# Patient Record
Sex: Male | Born: 1970 | Race: White | Hispanic: No | Marital: Married | Smoking: Never smoker
Health system: Southern US, Community
[De-identification: ages and names within clinical notes are randomized; demographics above are authoritative.]

## PROBLEM LIST (undated history)

## (undated) DIAGNOSIS — Z789 Other specified health status: Secondary | ICD-10-CM

## (undated) HISTORY — PX: NO PAST SURGERIES: SHX2092

---

## 2011-01-06 ENCOUNTER — Ambulatory Visit: Payer: Self-pay | Admitting: Internal Medicine

## 2014-02-27 ENCOUNTER — Ambulatory Visit: Payer: Self-pay | Admitting: Physician Assistant

## 2014-03-01 ENCOUNTER — Encounter (HOSPITAL_COMMUNITY): Payer: Self-pay | Admitting: Pharmacy Technician

## 2014-03-02 ENCOUNTER — Encounter (HOSPITAL_COMMUNITY): Payer: Self-pay

## 2014-03-02 ENCOUNTER — Other Ambulatory Visit (HOSPITAL_COMMUNITY): Payer: BC Managed Care – PPO

## 2014-03-02 ENCOUNTER — Encounter (HOSPITAL_COMMUNITY)
Admission: RE | Admit: 2014-03-02 | Discharge: 2014-03-02 | Disposition: A | Discharge status: Home / Self Care | Payer: BC Managed Care – PPO | Source: Ambulatory Visit | Attending: Orthopedic Surgery | Admitting: Orthopedic Surgery

## 2014-03-02 HISTORY — DX: Other specified health status: Z78.9

## 2014-03-02 LAB — CBC
HCT: 43.8 % (ref 39.0–52.0)
Hemoglobin: 14.9 g/dL (ref 13.0–17.0)
MCH: 30 pg (ref 26.0–34.0)
MCHC: 34 g/dL (ref 30.0–36.0)
MCV: 88.3 fL (ref 78.0–100.0)
PLATELETS: 181 10*3/uL (ref 150–400)
RBC: 4.96 MIL/uL (ref 4.22–5.81)
RDW: 12.8 % (ref 11.5–15.5)
WBC: 5.2 10*3/uL (ref 4.0–10.5)

## 2014-03-02 NOTE — Progress Notes (Signed)
Message left with scheduler ar Dr. Carlos LeveringGramig's office that orders are needed..Marland Kitchen

## 2014-03-02 NOTE — Pre-Procedure Instructions (Signed)
Billy Ramos  03/02/2014   Your procedure is scheduled on:  03-03-2014   Friday   [facility] at 5:30 AM.   Call this number if you have problems the morning of surgery: [phone]   Remember:   Do not eat food or drink liquids after midnight.    Take these medicines the morning of surgery with A SIP OF WATER: None   Do not wear jewelry.  Do not wear lotions, powders, or perfumes.    Do not shave 48 hours prior to surgery. Men may shave face and neck.   Do not bring valuables to the hospital.  [facility] is not responsible for any belongings or valuables.               Contacts, dentures or bridgework may not be worn into surgery.   Leave suitcase in the car. After surgery it may be brought to your room.   For patients admitted to the hospital, discharge time is determined by your  treatment team.               Patients discharged the day of surgery will not be allowed to drive home.   Name and phone number of your driver:    Special Instructions: See attached sheet for instructions on CHG shower/bath   Please read over the following fact sheets that you were given: Pain Booklet and Surgical Site Infection Prevention

## 2014-03-03 ENCOUNTER — Encounter (HOSPITAL_COMMUNITY): Payer: Self-pay | Admitting: *Deleted

## 2014-03-03 ENCOUNTER — Encounter (HOSPITAL_COMMUNITY)
Admission: RE | Disposition: A | Discharge status: Home / Self Care | Payer: Self-pay | Source: Ambulatory Visit | Attending: Orthopedic Surgery

## 2014-03-03 ENCOUNTER — Ambulatory Visit (HOSPITAL_COMMUNITY): Payer: BC Managed Care – PPO | Admitting: Anesthesiology

## 2014-03-03 ENCOUNTER — Ambulatory Visit (HOSPITAL_COMMUNITY)
Admission: RE | Admit: 2014-03-03 | Discharge: 2014-03-03 | Disposition: A | Discharge status: Home / Self Care | Payer: BC Managed Care – PPO | Source: Ambulatory Visit | Attending: Orthopedic Surgery | Admitting: Orthopedic Surgery

## 2014-03-03 ENCOUNTER — Encounter (HOSPITAL_COMMUNITY): Payer: BC Managed Care – PPO | Admitting: Anesthesiology

## 2014-03-03 DIAGNOSIS — Z01812 Encounter for preprocedural laboratory examination: Secondary | ICD-10-CM | POA: Insufficient documentation

## 2014-03-03 DIAGNOSIS — IMO0002 Reserved for concepts with insufficient information to code with codable children: Secondary | ICD-10-CM | POA: Insufficient documentation

## 2014-03-03 HISTORY — PX: OPEN REDUCTION INTERNAL FIXATION (ORIF) DISTAL PHALANX: SHX6236

## 2014-03-03 SURGERY — OPEN REDUCTION INTERNAL FIXATION (ORIF) DISTAL PHALANX
Anesthesia: General | Site: Finger | Laterality: Left

## 2014-03-03 MED ORDER — PROPOFOL 10 MG/ML IV BOLUS
INTRAVENOUS | Status: AC
  Filled 2014-03-03: qty 20

## 2014-03-03 MED ORDER — ONDANSETRON HCL 4 MG/2ML IJ SOLN
INTRAMUSCULAR | Status: AC
  Filled 2014-03-03: qty 2

## 2014-03-03 MED ORDER — FENTANYL CITRATE 0.05 MG/ML IJ SOLN
INTRAMUSCULAR | Status: AC
  Filled 2014-03-03: qty 5

## 2014-03-03 MED ORDER — FENTANYL CITRATE 0.05 MG/ML IJ SOLN
INTRAMUSCULAR | Status: DC | PRN
  Administered 2014-03-03: 25 ug via INTRAVENOUS
  Administered 2014-03-03: 50 ug via INTRAVENOUS

## 2014-03-03 MED ORDER — LIDOCAINE HCL (CARDIAC) 20 MG/ML IV SOLN
INTRAVENOUS | Status: DC | PRN
  Administered 2014-03-03: 80 mg via INTRAVENOUS

## 2014-03-03 MED ORDER — CEFAZOLIN SODIUM-DEXTROSE 2-3 GM-% IV SOLR
INTRAVENOUS | Status: AC
  Filled 2014-03-03: qty 50

## 2014-03-03 MED ORDER — CEPHALEXIN 500 MG PO CAPS: 500.0000 mg | ORAL_CAPSULE | Freq: Four times a day (QID) | ORAL | Status: AC

## 2014-03-03 MED ORDER — BUPIVACAINE HCL (PF) 0.25 % IJ SOLN
INTRAMUSCULAR | Status: DC | PRN
  Administered 2014-03-03: 7 mL

## 2014-03-03 MED ORDER — CEFAZOLIN SODIUM-DEXTROSE 2-3 GM-% IV SOLR
2.0000 g | Freq: Once | INTRAVENOUS | Status: AC
  Administered 2014-03-03: 2 g via INTRAVENOUS
  Filled 2014-03-03: qty 50

## 2014-03-03 MED ORDER — ONDANSETRON HCL 4 MG/2ML IJ SOLN
INTRAMUSCULAR | Status: DC | PRN
  Administered 2014-03-03: 4 mg via INTRAVENOUS

## 2014-03-03 MED ORDER — FENTANYL CITRATE 0.05 MG/ML IJ SOLN: 25.0000 ug | INTRAMUSCULAR | Status: DC | PRN

## 2014-03-03 MED ORDER — ROCURONIUM BROMIDE 50 MG/5ML IV SOLN
INTRAVENOUS | Status: AC
  Filled 2014-03-03: qty 1

## 2014-03-03 MED ORDER — PROPOFOL 10 MG/ML IV BOLUS
INTRAVENOUS | Status: DC | PRN
  Administered 2014-03-03: 200 mg via INTRAVENOUS

## 2014-03-03 MED ORDER — MIDAZOLAM HCL 2 MG/2ML IJ SOLN
INTRAMUSCULAR | Status: AC
  Filled 2014-03-03: qty 2

## 2014-03-03 MED ORDER — LIDOCAINE HCL (CARDIAC) 20 MG/ML IV SOLN
INTRAVENOUS | Status: AC
  Filled 2014-03-03: qty 5

## 2014-03-03 MED ORDER — MIDAZOLAM HCL 5 MG/5ML IJ SOLN
INTRAMUSCULAR | Status: DC | PRN
  Administered 2014-03-03 (×2): 1 mg via INTRAVENOUS

## 2014-03-03 MED ORDER — OXYCODONE HCL 5 MG PO TABS: 5.0000 mg | ORAL_TABLET | ORAL | Status: AC | PRN

## 2014-03-03 MED ORDER — BUPIVACAINE HCL (PF) 0.25 % IJ SOLN
INTRAMUSCULAR | Status: AC
  Filled 2014-03-03: qty 30

## 2014-03-03 MED ORDER — LACTATED RINGERS IV SOLN
INTRAVENOUS | Status: DC | PRN
Start: 2014-03-03 — End: 2014-03-03
  Administered 2014-03-03: 07:00:00 via INTRAVENOUS

## 2014-03-03 MED ORDER — ONDANSETRON HCL 4 MG/2ML IJ SOLN
4.0000 mg | Freq: Four times a day (QID) | INTRAMUSCULAR | Status: AC | PRN
  Administered 2014-03-03: 4 mg via INTRAVENOUS

## 2014-03-03 MED ORDER — SUCCINYLCHOLINE CHLORIDE 20 MG/ML IJ SOLN
INTRAMUSCULAR | Status: AC
  Filled 2014-03-03: qty 1

## 2014-03-03 MED ORDER — 0.9 % SODIUM CHLORIDE (POUR BTL) OPTIME
TOPICAL | Status: DC | PRN
  Administered 2014-03-03: 1000 mL

## 2014-03-03 MED ORDER — OXYCODONE HCL 5 MG PO TABS: 5.0000 mg | ORAL_TABLET | Freq: Once | ORAL | Status: DC | PRN

## 2014-03-03 MED ORDER — OXYCODONE HCL 5 MG/5ML PO SOLN: 5.0000 mg | Freq: Once | ORAL | Status: DC | PRN

## 2014-03-03 SURGICAL SUPPLY — 67 items
BANDAGE ELASTIC 3 VELCRO ST LF (GAUZE/BANDAGES/DRESSINGS) ×3 IMPLANT
BANDAGE ELASTIC 4 VELCRO ST LF (GAUZE/BANDAGES/DRESSINGS) IMPLANT
BANDAGE GAUZE ELAST BULKY 4 IN (GAUZE/BANDAGES/DRESSINGS) ×3 IMPLANT
BIT DRILL 1.0 W/MINI QC (BIT) ×2 IMPLANT
BIT DRILL 1.0MM W/MINI QC (BIT) ×1
BIT DRILL 1.1 MINI QC NONSTRL (BIT) IMPLANT
BNDG COHESIVE 1X5 TAN STRL LF (GAUZE/BANDAGES/DRESSINGS) IMPLANT
BNDG CONFORM 3 STRL LF (GAUZE/BANDAGES/DRESSINGS) ×6 IMPLANT
BNDG ELASTIC 2 VLCR STRL LF (GAUZE/BANDAGES/DRESSINGS) ×3 IMPLANT
CANISTER SUCTION 2500CC (MISCELLANEOUS) ×3 IMPLANT
CAP PIN ORTHO PINK (CAP) IMPLANT
CAP PIN PROTECTOR ORTHO WHT (CAP) IMPLANT
CORDS BIPOLAR (ELECTRODE) ×3 IMPLANT
COVER SURGICAL LIGHT HANDLE (MISCELLANEOUS) ×3 IMPLANT
CUFF TOURNIQUET SINGLE 18IN (TOURNIQUET CUFF) ×3 IMPLANT
CUFF TOURNIQUET SINGLE 24IN (TOURNIQUET CUFF) IMPLANT
DRAPE OEC MINIVIEW 54X84 (DRAPES) ×3 IMPLANT
DRAPE SURG 17X23 STRL (DRAPES) ×3 IMPLANT
DRSG EMULSION OIL 3X3 NADH (GAUZE/BANDAGES/DRESSINGS) ×3 IMPLANT
GAUZE SPONGE 2X2 8PLY STRL LF (GAUZE/BANDAGES/DRESSINGS) ×1 IMPLANT
GAUZE XEROFORM 1X8 LF (GAUZE/BANDAGES/DRESSINGS) ×3 IMPLANT
GAUZE XEROFORM 5X9 LF (GAUZE/BANDAGES/DRESSINGS) ×3 IMPLANT
GLOVE BIOGEL M STRL SZ7.5 (GLOVE) ×3 IMPLANT
GLOVE SS BIOGEL STRL SZ 8 (GLOVE) ×1 IMPLANT
GLOVE SUPERSENSE BIOGEL SZ 8 (GLOVE) ×2
GOWN STRL REUS W/ TWL LRG LVL3 (GOWN DISPOSABLE) ×2 IMPLANT
GOWN STRL REUS W/ TWL XL LVL3 (GOWN DISPOSABLE) ×2 IMPLANT
GOWN STRL REUS W/TWL LRG LVL3 (GOWN DISPOSABLE) ×4
GOWN STRL REUS W/TWL XL LVL3 (GOWN DISPOSABLE) ×4
K-WIRE .045X6 DBL TRO NS (WIRE)
K-WIRE 6 .028 (WIRE)
K-WIRE DBL TRONS .035X6 (WIRE) ×9
K-WIRE SMTH SNGL TROCAR .028X4 (WIRE)
KIT BASIN OR (CUSTOM PROCEDURE TRAY) ×3 IMPLANT
KIT ROOM TURNOVER OR (KITS) ×3 IMPLANT
KWIRE .045X6 DBL TRO NS (WIRE) IMPLANT
KWIRE 6 .028 (WIRE) IMPLANT
KWIRE DBL TRONS .035X6 (WIRE) ×3 IMPLANT
KWIRE SMTH SNGL TROCAR .028X4 (WIRE) IMPLANT
NEEDLE HYPO 25GX1X1/2 BEV (NEEDLE) ×3 IMPLANT
NS IRRIG 1000ML POUR BTL (IV SOLUTION) ×3 IMPLANT
PACK ORTHO EXTREMITY (CUSTOM PROCEDURE TRAY) ×3 IMPLANT
PAD ARMBOARD 7.5X6 YLW CONV (MISCELLANEOUS) ×3 IMPLANT
PAD CAST 3X4 CTTN HI CHSV (CAST SUPPLIES) ×1 IMPLANT
PAD CAST 4YDX4 CTTN HI CHSV (CAST SUPPLIES) IMPLANT
PADDING CAST COTTON 3X4 STRL (CAST SUPPLIES) ×2
PADDING CAST COTTON 4X4 STRL (CAST SUPPLIES)
SCREW 1.3X9MM (Screw) ×2 IMPLANT
SCREW BN 9X1.3XST NONLOCK (Screw) ×1 IMPLANT
SCREW NON-LOCK 1.3X12 (Screw) ×3 IMPLANT
SOLUTION BETADINE 4OZ (MISCELLANEOUS) ×3 IMPLANT
SPONGE GAUZE 2X2 STER 10/PKG (GAUZE/BANDAGES/DRESSINGS) ×2
SPONGE GAUZE 4X4 12PLY (GAUZE/BANDAGES/DRESSINGS) IMPLANT
SPONGE GAUZE 4X4 12PLY STER LF (GAUZE/BANDAGES/DRESSINGS) ×3 IMPLANT
SPONGE SCRUB IODOPHOR (GAUZE/BANDAGES/DRESSINGS) ×3 IMPLANT
SUCTION FRAZIER TIP 10 FR DISP (SUCTIONS) IMPLANT
SUT CHROMIC 5 0 P 3 (SUTURE) ×3 IMPLANT
SUT MERSILENE 4 0 P 3 (SUTURE) IMPLANT
SUT PROLENE 4 0 PS 2 18 (SUTURE) ×3 IMPLANT
SUT VIC AB 2-0 CT1 27 (SUTURE)
SUT VIC AB 2-0 CT1 TAPERPNT 27 (SUTURE) IMPLANT
SYR CONTROL 10ML LL (SYRINGE) ×3 IMPLANT
TOWEL OR 17X24 6PK STRL BLUE (TOWEL DISPOSABLE) ×3 IMPLANT
TOWEL OR 17X26 10 PK STRL BLUE (TOWEL DISPOSABLE) ×3 IMPLANT
TUBE CONNECTING 12'X1/4 (SUCTIONS) ×1
TUBE CONNECTING 12X1/4 (SUCTIONS) ×2 IMPLANT
UNDERPAD 30X30 INCONTINENT (UNDERPADS AND DIAPERS) ×3 IMPLANT

## 2014-03-03 NOTE — Anesthesia Preprocedure Evaluation (Addendum)
Anesthesia Evaluation  Patient identified by MRN, date of birth, ID band Patient awake    Reviewed: Allergy & Precautions, H&P , NPO status , Patient's Chart, lab work & pertinent test results  Airway Mallampati: II TM Distance: >3 FB Neck ROM: full    Dental  (+) Teeth Intact, Dental Advisory Given   Pulmonary neg pulmonary ROS,          Cardiovascular negative cardio ROS      Neuro/Psych    GI/Hepatic   Endo/Other    Renal/GU      Musculoskeletal   Abdominal   Peds  Hematology   Anesthesia Other Findings   Reproductive/Obstetrics                          Anesthesia Physical Anesthesia Plan  ASA: I  Anesthesia Plan: General   Post-op Pain Management:    Induction: Intravenous  Airway Management Planned: LMA  Additional Equipment:   Intra-op Plan:   Post-operative Plan:   Informed Consent: I have reviewed the patients History and Physical, chart, labs and discussed the procedure including the risks, benefits and alternatives for the proposed anesthesia with the patient or authorized representative who has indicated his/her understanding and acceptance.   Dental advisory given  Plan Discussed with: CRNA, Anesthesiologist and Surgeon  Anesthesia Plan Comments:        Anesthesia Quick Evaluation

## 2014-03-03 NOTE — Discharge Instructions (Signed)
Keep bandage clean and dry.  Call for any problems.  No smoking.  Criteria for driving a car: you should be off your pain medicine for 7-8 hours, able to drive one handed(confident), thinking clearly and feeling able in your judgement to drive. Continue elevation as it will decrease swelling.  If instructed by MD move your fingers within the confines of the bandage/splint.  Use ice if instructed by your MD. Call immediately for any sudden loss of feeling in your hand/arm or change in functional abilities of the extremity.   We recommend that you to take vitamin C 1000 mg a day to promote healing we also recommend that if you require her pain medicine that he take a stool softener to prevent constipation as most pain medicines will have constipation side effects. We recommend either Peri-Colace or Senokot and recommend that you also consider adding MiraLAX to prevent the constipation affects from pain medicine if you are required to use them. These medicines are over the counter and maybe purchased at a local pharmacy.  You did great ! Call for any problems

## 2014-03-03 NOTE — Anesthesia Procedure Notes (Signed)
Procedure Name: LMA Insertion Date/Time: 03/03/2014 7:59 AM Performed by: Leonel Ramsay'LAUGHLIN, Ellakate Gonsalves H Pre-anesthesia Checklist: Patient identified, Timeout performed, Emergency Drugs available, Suction available and Patient being monitored Patient Re-evaluated:Patient Re-evaluated prior to inductionOxygen Delivery Method: Circle system utilized Preoxygenation: Pre-oxygenation with 100% oxygen Intubation Type: IV induction LMA: LMA inserted LMA Size: 5.0 Number of attempts: 1 Placement Confirmation: positive ETCO2 and breath sounds checked- equal and bilateral Tube secured with: Tape Dental Injury: Teeth and Oropharynx as per pre-operative assessment

## 2014-03-03 NOTE — Transfer of Care (Signed)
Immediate Anesthesia Transfer of Care Note  Patient: Billy Ramos  Procedure(s) Performed: Procedure(s): OPEN REDUCTION INTERNAL FIXATION (ORIF) LEFT MIDDLE FINGER DISTAL INTERPHALANGEAL JOINT (Left)  Patient Location: PACU  Anesthesia Type:General  Level of Consciousness: awake, alert  and oriented  Airway & Oxygen Therapy: Patient Spontanous Breathing and Patient connected to nasal cannula oxygen  Post-op Assessment: Report given to PACU RN and Post -op Vital signs reviewed and stable  Post vital signs: Reviewed and stable  Complications: No apparent anesthesia complications

## 2014-03-03 NOTE — Op Note (Signed)
See dictation #[id number] Osceola Depaz Md

## 2014-03-03 NOTE — Anesthesia Postprocedure Evaluation (Signed)
Anesthesia Post Note  Patient: Billy Ramos  Procedure(s) Performed: Procedure(s) (LRB): OPEN REDUCTION INTERNAL FIXATION (ORIF) LEFT MIDDLE FINGER DISTAL INTERPHALANGEAL JOINT (Left)  Anesthesia type: General  Patient location: PACU  Post pain: Pain level controlled and Adequate analgesia  Post assessment: Post-op Vital signs reviewed, Patient's Cardiovascular Status Stable, Respiratory Function Stable, Patent Airway and Pain level controlled  Last Vitals:  Filed Vitals:   03/03/14 0949  BP: 130/84  Pulse: 80  Temp: 36.4 C  Resp: 21    Post vital signs: Reviewed and stable  Level of consciousness: awake, alert  and oriented  Complications: No apparent anesthesia complications

## 2014-03-03 NOTE — H&P (Signed)
Billy Ramos is an 43 y.o. male.   Chief Complaint: left middle finger fracture at the DIP joint-bony mallet HPI: Patient presents for evaluation and treatment of the of their upper extremity predicament. The patient denies neck back chest or of abdominal pain. The patient notes that they have no lower extremity problems. The patient from primarily complains of the upper extremity pain noted.  Past Medical History  Diagnosis Date  . Medical history non-contributory     Past Surgical History  Procedure Laterality Date  . No past surgeries      History reviewed. No pertinent family history. Social History:  reports that he has never smoked. He does not have any smokeless tobacco history on file. He reports that he does not drink alcohol or use illicit drugs.  Allergies: No Known Allergies  No prescriptions prior to admission    Results for orders placed during the hospital encounter of 03/02/14 (from the past 48 hour(s))  CBC     Status: None   Collection Time    03/02/14  9:19 AM      Result Value Ref Range   WBC 5.2  4.0 - 10.5 K/uL   RBC 4.96  4.22 - 5.81 MIL/uL   Hemoglobin 14.9  13.0 - 17.0 g/dL   HCT 40.943.8  81.139.0 - 91.452.0 %   MCV 88.3  78.0 - 100.0 fL   MCH 30.0  26.0 - 34.0 pg   MCHC 34.0  30.0 - 36.0 g/dL   RDW 78.212.8  95.611.5 - 21.315.5 %   Platelets 181  150 - 400 K/uL   No results found.  Review of Systems  Eyes: Negative.   Respiratory: Negative.   Cardiovascular: Negative.   Genitourinary: Negative.   Skin: Negative.   Neurological: Negative.   Endo/Heme/Allergies: Negative.   Psychiatric/Behavioral: Negative.     Blood pressure 131/81, pulse 60, temperature 97.7 F (36.5 C), temperature source Oral, SpO2 100.00%. 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  The patient is noted to have a normal HEENT  exam.  Lung fields show equal chest expansion and no shortness of breath  abdomen exam is nontender without distention.  Lower extremity examination does not show any fracture dislocation or blood clot symptoms.  Pelvis is stable neck and back are stable and nontender  Assessment/Plan We are planning surgery for your upper extremity. The risk and benefits of surgery include risk of bleeding infection anesthesia damage to normal structures and failure of the surgery to accomplish its intended goals of relieving symptoms and restoring function with this in mind we'll going to proceed. I have specifically discussed with the patient the pre-and postoperative regime and the does and don'ts and risk and benefits in great detail. Risk and benefits of surgery also include risk of dystrophy chronic nerve pain failure of the healing process to go onto completion and other inherent risks of surgery The relavent the pathophysiology of the disease/injury process, as well as the alternatives for treatment and postoperative course of action has been discussed in great detail with the patient who desires to proceed.  We will do everything in our power to help you (the patient) restore function to the upper extremity. Is a pleasure to see this patient today.   Karen ChafeGRAMIG III,Peytyn Trine M 03/03/2014, 7:54 AM

## 2014-03-04 NOTE — Op Note (Signed)
NAMBurnis Kingfisher:  Banner, Jarrius            ACCOUNT NO.:  [id number]  MEDICAL RECORD NO.:  [id number]  LOCATION:  [facility]                         FACILITY:  [facility]  PHYSICIAN:  Dionne AnoWilliam M. Boone Gear, M.D.DATE OF BIRTH:  Dec 15, 1970  DATE OF PROCEDURE: DATE OF DISCHARGE:  03/03/2014                              OPERATIVE REPORT   PREOPERATIVE DIAGNOSIS:  Left middle finger intra-articular distal interphalangeal joint fracture.  POSTOPERATIVE DIAGNOSIS:  Left middle finger intra-articular distal interphalangeal joint fracture.  PROCEDURE: 1. Open reduction and internal fixation, intra-articular distal     interphalangeal joint fracture, left middle finger. 2. AP, lateral, and oblique x-rays of left middle finger performed,     evaluated, and interpreted by myself. 3. Extensor tenolysis, tenosynovectomy, extensive in nature, left     middle finger.  SURGEON:  Dionne AnoWilliam M. Amanda PeaGramig, M.D.  ASSISTANT:  None.  COMPLICATIONS:  None.  ANESTHESIA:  General.  TOURNIQUET TIME:  Less than an hour.  INDICATIONS:  The patient is a pleasant male, 43 years of age, with malunited/nonunited bony mallet fracture.  He understands risks and benefits of surgery and desires to proceed.  He has a swan neck deformity, chronic change, and unfortunately is late in his presentation.  I have discussed with him the risks, benefits, do's and don'ts, timeframe duration of recovery and we will proceed accordingly with the above-mentioned treatment.  OPERATIVE PROCEDURE:  The patient was seen by myself and Anesthesia, taken to the operating suite, underwent smooth induction of general anesthesia, prepped and draped in the usual sterile fashion.  Time-out was called.  Pre and postop check was complete.  Antibiotics given.  The patient underwent a curvilinear incision after a sterile field was secured with Betadine scrub and paint.  Curvilinear incision was made of the left middle finger.  Skin flap was elevated.  Dissection  was carried back.  Following this, I performed a very careful and cautious extensor tenolysis, tenosynovectomy as this was a nonunion, malunion type situation where the patient was late in presentation.  An extensive tenolysis, tenosynovectomy was accomplished.  Following this, we then very carefully mobilized the fracture fragments from its bed of fibrous tissue.  I then very carefully and cautiously with knife blade scraped the proximal fragment which was attached to the extensor apparatus. Good bone was noted.  Following this, I was very careful with the articular surface and prepared the volar fragment which was subluxed.  I placed the volar fragment in a reduced position, checked it under AP, lateral, and oblique x-rays and following this, preset K-wires for ORIF. I placed a 0.045 K-wire from distal to proximal intramedullary to hold the joint reduced.  This was done without difficulty.  Following this, I took the fragment, set it nicely with the extensor tendon attached to it of course in the articular bed of the joint and made sure it was perfectly reduced.  I then placed two 0.035 K-wires from dorsal to palmar engaging the middle phalanx head, bent it back, and over-reduced the construct somewhat.  Following this, I then drilled with a 1.0 mm drill bit and placed a 1.3 mm screw to engage the fracture against the main distal phalanx.  This aligned the construct and  things looked much better.  I left the 0.035 K- wires intact and removed these early in the postop period.  I bent the wires appropriately and I did bury the 0.045 K-wire in the distal pulp tissue.  The patient tolerated this well.  I placed Sensorcaine with epinephrine, postop analgesia in the wound.  In addition to this, I went ahead and performed irrigation and closed the wound with Prolene and chromic.  Adaptic, Xeroform, and splint were placed.  I performed, AP, lateral, and oblique x-rays and interpreted these  and felt that all looked well.  Thus, ORIF of complex intra-articular fracture with extensor tenolysis, tenosynovectomy, and x-ray eval were accomplished.  Excellent recreation of the anatomy was accomplished and hopefully this will bode well for this gentleman in the future.  I will see him back in 12 days.  Sutures out, x-rays to be taken, and at that time, we will go ahead and consider K-wire removal versus removal at 4 weeks.  I will have therapy see him to begin PIP range of motion, and in addition to this, keep the DIP joint still for 6 weeks.  At 6 weeks, we will begin a weaning program predicated on x-rays looking good and after I remove the IM pin.  Thus in 6 weeks, we will remove the IM pin in the office as a separate procedure.  This will be a deep pin removal and at roughly 6-8 weeks, more trending towards 8 weeks, begin the weaning program.  These notes have been discussed and all questions have been encouraged and answered.     Dionne AnoWilliam M. Amanda PeaGramig, M.D.     [facility]  D:  03/03/2014  T:  03/03/2014  Job:  [id number]

## 2014-03-10 ENCOUNTER — Encounter (HOSPITAL_COMMUNITY): Payer: Self-pay | Admitting: Orthopedic Surgery

## 2015-06-08 IMAGING — CR LEFT MIDDLE FINGER 2+V
3 series · 3 of 3 positions shown · non-contrast
Comparison: None.

CLINICAL DATA: Persistent long finger pain status post jamming
injury 3 weeks ago

EXAM:
LEFT MIDDLE FINGER 2+V

[finger ap]
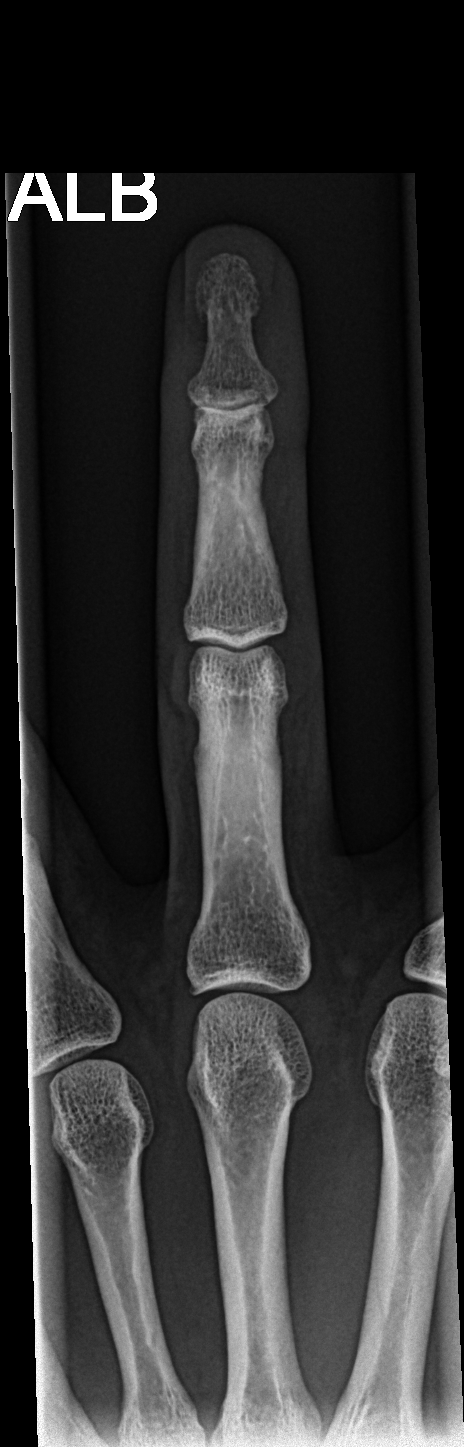

[finger obl]
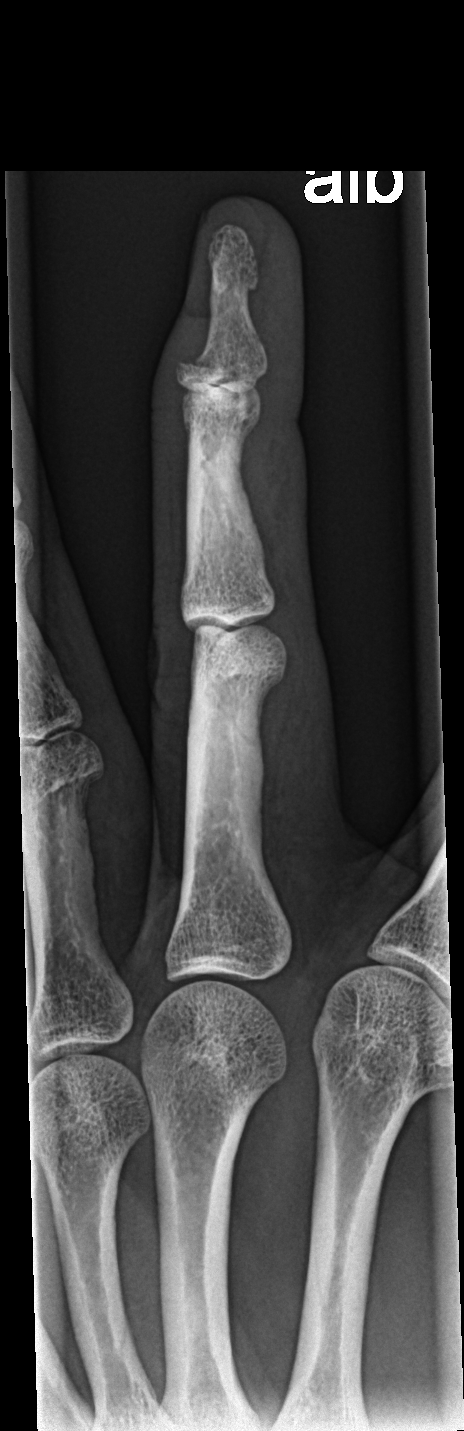

[finger lat]
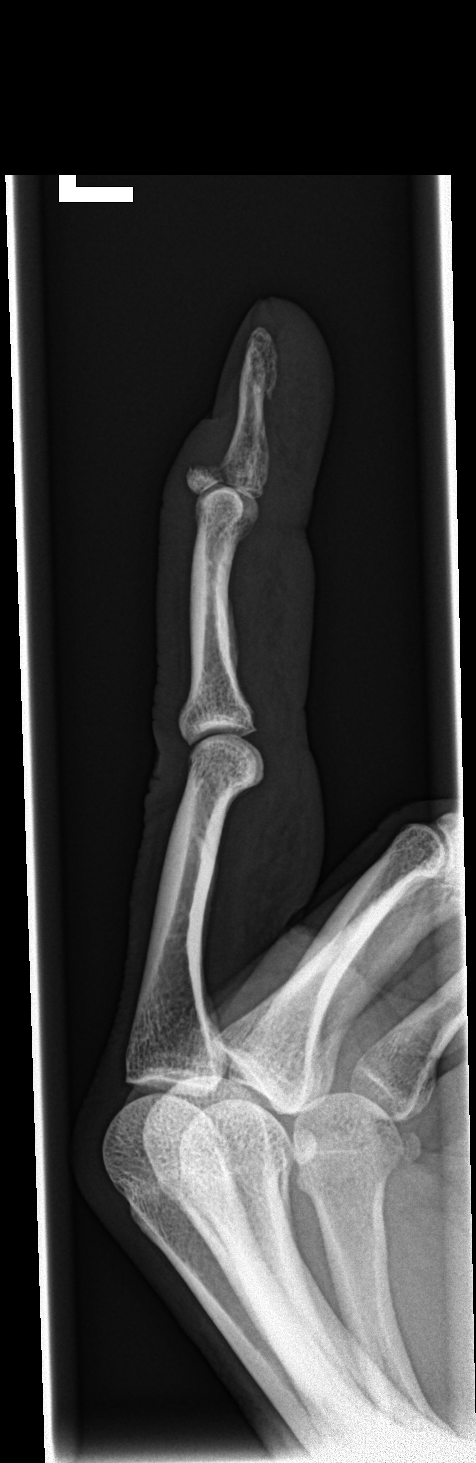

[3 of 3 positions shown; findings below may reference images not displayed]

FINDINGS: There is a intra-articular fracture of the base of the distal
phalanx of the long finger. The proximal and middle phalanges are
intact. There is no periosteal reaction.
IMPRESSION: There is a displaced fracture of the dorsal aspect of the base of
the distal phalanx of the long finger.

## 2016-08-30 ENCOUNTER — Encounter: Payer: Self-pay | Admitting: Gynecology

## 2016-08-30 ENCOUNTER — Ambulatory Visit
Admission: EM | Admit: 2016-08-30 | Discharge: 2016-08-30 | Disposition: A | Discharge status: Home / Self Care | Payer: BLUE CROSS/BLUE SHIELD | Attending: Family Medicine | Admitting: Family Medicine

## 2016-08-30 DIAGNOSIS — J019 Acute sinusitis, unspecified: Secondary | ICD-10-CM | POA: Diagnosis not present

## 2016-08-30 LAB — RAPID STREP SCREEN (MED CTR MEBANE ONLY): Streptococcus, Group A Screen (Direct): NEGATIVE

## 2016-08-30 MED ORDER — AMOXICILLIN-POT CLAVULANATE 875-125 MG PO TABS: 1.0000 | ORAL_TABLET | Freq: Two times a day (BID) | ORAL | 0 refills | Status: AC

## 2016-08-30 NOTE — ED Provider Notes (Signed)
CSN: [id number]     Arrival date & time 08/30/16  [id number] History   First MD Initiated Contact with Patient 08/30/16 1058     Chief Complaint  Patient presents with  . Sore Throat   (Consider location/radiation/quality/duration/timing/severity/associated sxs/prior Treatment) Patient is a healthy 45 year old male, with no medical history, presents today for nasal congestion, sore throat and cough. Patient reports that the nasal congestion started 3 weeks ago. The sore throat and the cough started 2 weeks ago. Patient reports the symptoms are not improving. Patient has not tried anything over-the-counter to help. Patient denies fever at home. He does endorses some sinus pressure. Patient denies sick contact.      Past Medical History:  Diagnosis Date  . Medical history non-contributory    Past Surgical History:  Procedure Laterality Date  . NO PAST SURGERIES    . OPEN REDUCTION INTERNAL FIXATION (ORIF) DISTAL PHALANX Left 03/03/2014   Procedure: OPEN REDUCTION INTERNAL FIXATION (ORIF) LEFT MIDDLE FINGER DISTAL INTERPHALANGEAL JOINT;  Surgeon: Dominica SeverinWilliam Gramig, MD;  Location: [facility] OR;  Service: Orthopedics;  Laterality: Left;   No family history on file. Social History  Substance Use Topics  . Smoking status: Never Smoker  . Smokeless tobacco: Never Used  . Alcohol use No    Review of Systems  Constitutional: Negative for chills and fever.  HENT: Positive for congestion, rhinorrhea, sinus pain and sinus pressure. Negative for ear pain and sore throat.   Respiratory: Positive for cough. Negative for shortness of breath.   Cardiovascular: Negative for chest pain and palpitations.  Gastrointestinal: Negative for abdominal pain, nausea and vomiting.  Neurological: Positive for headaches. Negative for dizziness.    Allergies  Patient has no known allergies.  Home Medications   Prior to Admission medications   Medication Sig Start Date End Date Taking? Authorizing Provider   amoxicillin-clavulanate (AUGMENTIN) 875-125 MG tablet Take 1 tablet by mouth every 12 (twelve) hours. 08/30/16 09/06/16  Lucia EstelleFeng Jacqueli Pangallo, NP  cephALEXin (KEFLEX) 500 MG capsule Take 1 capsule (500 mg total) by mouth 4 (four) times daily. 03/03/14   Dominica SeverinWilliam Gramig, MD  oxyCODONE (OXY IR/ROXICODONE) 5 MG immediate release tablet Take 1 tablet (5 mg total) by mouth every 4 (four) hours as needed for severe pain. 03/03/14   Dominica SeverinWilliam Gramig, MD   Meds Ordered and Administered this Visit  Medications - No data to display  BP 110/68 (BP Location: Left Arm)   Pulse 76   Temp 98.5 F (36.9 C) (Oral)   Resp 16   Ht 6' (1.829 m)   Wt 185 lb (83.9 kg)   SpO2 98%   BMI 25.09 kg/m  No data found.   Physical Exam  Constitutional: He is oriented to person, place, and time. He appears well-developed and well-nourished.  HENT:  Head: Normocephalic and atraumatic.  Right Ear: External ear normal.  Left Ear: External ear normal.  Nose: Nose normal.  Mouth/Throat: Oropharynx is clear and moist. No oropharyngeal exudate.  TM pearly gray bilaterally with no erythema. No sinus pain on percussion.  Eyes: Conjunctivae and EOM are normal. Pupils are equal, round, and reactive to light.  Neck: Normal range of motion.  Cardiovascular: Normal rate, regular rhythm and normal heart sounds.   No murmur heard. Pulmonary/Chest: Effort normal and breath sounds normal. No respiratory distress. He has no wheezes.  Abdominal: Soft. Bowel sounds are normal. He exhibits no distension. There is no tenderness.  Lymphadenopathy:    He has no cervical adenopathy.  Neurological: He is  alert and oriented to person, place, and time.  Skin: Skin is warm.  Psychiatric: He has a normal mood and affect.  Vitals reviewed.   Urgent Care Course   Clinical Course     Procedures (including critical care time)  Labs Review Labs Reviewed  RAPID STREP SCREEN (NOT [facility])  CULTURE, GROUP A STREP [facility])    Imaging Review No  results found.  MDM   1. Acute non-recurrent sinusitis, unspecified location    Rapid strep negative. Patient appears well with no abnormal finding noted on physical exam. Given the patient have had the symptoms for over 2-3 weeks, will treat presumptively for sinusitis with Augmentin twice a day for 7 days.  Informed to follow up with PCP if no improvement is noted.    Lucia EstelleFeng Nicandro Perrault, NP 08/30/16 1113

## 2016-08-30 NOTE — ED Triage Notes (Signed)
Patient c/o sore throat / chest congestion / cough x 2 weeks.

## 2016-09-02 LAB — CULTURE, GROUP A STREP (THRC)
